# Patient Record
Sex: Male | Born: 1945 | Race: White | Hispanic: No | Marital: Married | Smoking: Former smoker
Health system: Southern US, Community
[De-identification: ages and names within clinical notes are randomized; demographics above are authoritative.]

## PROBLEM LIST (undated history)

## (undated) DIAGNOSIS — I10 Essential (primary) hypertension: Secondary | ICD-10-CM

## (undated) HISTORY — PX: CORONARY ANGIOPLASTY WITH STENT PLACEMENT: SHX49

---

## 2016-03-15 ENCOUNTER — Emergency Department (HOSPITAL_COMMUNITY)
Admission: EM | Admit: 2016-03-15 | Discharge: 2016-03-15 | Disposition: A | Discharge status: Home / Self Care | Payer: Non-veteran care | Attending: Emergency Medicine | Admitting: Emergency Medicine

## 2016-03-15 ENCOUNTER — Emergency Department (HOSPITAL_COMMUNITY): Payer: Non-veteran care

## 2016-03-15 ENCOUNTER — Encounter (HOSPITAL_COMMUNITY): Payer: Self-pay | Admitting: Emergency Medicine

## 2016-03-15 DIAGNOSIS — R52 Pain, unspecified: Secondary | ICD-10-CM

## 2016-03-15 DIAGNOSIS — Z87891 Personal history of nicotine dependence: Secondary | ICD-10-CM | POA: Diagnosis not present

## 2016-03-15 DIAGNOSIS — Z7982 Long term (current) use of aspirin: Secondary | ICD-10-CM | POA: Diagnosis not present

## 2016-03-15 DIAGNOSIS — I10 Essential (primary) hypertension: Secondary | ICD-10-CM | POA: Diagnosis not present

## 2016-03-15 DIAGNOSIS — Z79899 Other long term (current) drug therapy: Secondary | ICD-10-CM | POA: Insufficient documentation

## 2016-03-15 DIAGNOSIS — Z955 Presence of coronary angioplasty implant and graft: Secondary | ICD-10-CM | POA: Diagnosis not present

## 2016-03-15 DIAGNOSIS — M4806 Spinal stenosis, lumbar region: Secondary | ICD-10-CM | POA: Diagnosis not present

## 2016-03-15 DIAGNOSIS — Z7984 Long term (current) use of oral hypoglycemic drugs: Secondary | ICD-10-CM | POA: Diagnosis not present

## 2016-03-15 DIAGNOSIS — R109 Unspecified abdominal pain: Secondary | ICD-10-CM | POA: Insufficient documentation

## 2016-03-15 DIAGNOSIS — M549 Dorsalgia, unspecified: Secondary | ICD-10-CM | POA: Diagnosis present

## 2016-03-15 DIAGNOSIS — M48061 Spinal stenosis, lumbar region without neurogenic claudication: Secondary | ICD-10-CM

## 2016-03-15 HISTORY — DX: Essential (primary) hypertension: I10

## 2016-03-15 MED ORDER — OXYCODONE HCL 5 MG PO TABS
5.0000 mg | ORAL_TABLET | Freq: Once | ORAL | Status: AC
  Administered 2016-03-15: 5 mg via ORAL
  Filled 2016-03-15: qty 1

## 2016-03-15 MED ORDER — ACETAMINOPHEN 500 MG PO TABS
1000.0000 mg | ORAL_TABLET | Freq: Once | ORAL | Status: AC
  Administered 2016-03-15: 1000 mg via ORAL
  Filled 2016-03-15: qty 2

## 2016-03-15 MED ORDER — MORPHINE SULFATE 15 MG PO TABS: 15.0000 mg | ORAL_TABLET | ORAL | 0 refills | Status: AC | PRN

## 2016-03-15 NOTE — ED Triage Notes (Signed)
Pt c/o B/L thigh pain that occurs when he stands up and walks for a while.

## 2016-03-15 NOTE — ED Provider Notes (Signed)
[facility]-EMERGENCY DEPT Provider Note   CSN: [id number] Arrival date & time: 03/15/16  1459     History   Chief Complaint Chief Complaint  Patient presents with  . Leg Pain    HPI Todd Ortiz is a 70 y.o. male.  70 yo M with a chief complaints of bilateral hamstring pain. Patient points just beneath his HTN bilaterally. This occurs when the patient stands up and walks. The patient walks for long distances normally improves. Patient having significant pain with ambulation. Denies cauda equina symptoms. Denies fevers. Denies injury.   The history is provided by the patient.  Leg Pain   This is a new problem. The current episode started more than 1 week ago. The problem occurs constantly. The problem has been gradually worsening. Pain location: Bilateral hamstrings. The pain is at a severity of 7/10. The pain is moderate. Associated symptoms include stiffness. He has tried nothing for the symptoms. The treatment provided no relief. There has been no history of extremity trauma.    Past Medical History:  Diagnosis Date  . Hypertension     There are no active problems to display for this patient.   Past Surgical History:  Procedure Laterality Date  . CORONARY ANGIOPLASTY WITH STENT PLACEMENT         Home Medications    Prior to Admission medications   Medication Sig Start Date End Date Taking? Authorizing Provider  aspirin EC 81 MG tablet Take 81 mg by mouth daily.   Yes Historical Provider, MD  ATORVASTATIN CALCIUM PO Take 1 tablet by mouth daily.   Yes Historical Provider, MD  lisinopril (PRINIVIL,ZESTRIL) 10 MG tablet Take 10 mg by mouth daily.   Yes Historical Provider, MD  metFORMIN (GLUCOPHAGE) 500 MG tablet Take 500 mg by mouth 2 (two) times daily with a meal.   Yes Historical Provider, MD  morphine (MSIR) 15 MG tablet Take 1 tablet (15 mg total) by mouth every 4 (four) hours as needed for severe pain. 03/15/16   Melene Planan Threasa Kinch, DO    Family History No family history on  file.  Social History Social History  Substance Use Topics  . Smoking status: Former Games developermoker  . Smokeless tobacco: Never Used  . Alcohol use Yes     Allergies   Review of patient's allergies indicates no known allergies.   Review of Systems Review of Systems  Constitutional: Negative for chills and fever.  HENT: Negative for congestion and facial swelling.   Eyes: Negative for discharge and visual disturbance.  Respiratory: Negative for shortness of breath.   Cardiovascular: Negative for chest pain and palpitations.  Gastrointestinal: Negative for abdominal pain, diarrhea and vomiting.  Musculoskeletal: Positive for back pain, gait problem, myalgias and stiffness. Negative for arthralgias.  Skin: Negative for color change and rash.  Neurological: Negative for tremors, syncope and headaches.  Psychiatric/Behavioral: Negative for confusion and dysphoric mood.     Physical Exam Updated Vital Signs BP 146/76 (BP Location: Right Arm)   Pulse 76   Temp 98.3 F (36.8 C) (Oral)   Resp 18   Ht 5\' 8"  (1.727 m)   Wt 229 lb 9 oz (104.1 kg)   SpO2 97%   BMI 34.90 kg/m   Physical Exam  Constitutional: He is oriented to person, place, and time. He appears well-developed and well-nourished.  HENT:  Head: Normocephalic and atraumatic.  Eyes: Conjunctivae and EOM are normal. Pupils are equal, round, and reactive to light.  Neck: Normal range of motion. No JVD  present.  Cardiovascular: Normal rate and regular rhythm.   Pulmonary/Chest: Effort normal. No stridor. No respiratory distress.  Abdominal: He exhibits no distension. There is no tenderness. There is no guarding.  Musculoskeletal: Normal range of motion. He exhibits tenderness (Hamstring bilaterally). He exhibits no edema.  No midline spinal tenderness. Pulse motor and sensation is intact distally bilaterally.  Neurological: He is alert and oriented to person, place, and time.  Skin: Skin is warm and dry.  Psychiatric: He  has a normal mood and affect. His behavior is normal.     ED Treatments / Results  Labs (all labs ordered are listed, but only abnormal results are displayed) Labs Reviewed - No data to display  EKG  EKG Interpretation None       Radiology Ct L-spine No Charge  Result Date: 03/15/2016 CLINICAL DATA:  Chronic back pain. EXAM: CT LUMBAR SPINE WITHOUT CONTRAST TECHNIQUE: Multiplanar CT image reconstructions of the lumbar spine were generated from noncontrast abdominal CT. COMPARISON:  None. FINDINGS: Normal lumbar segmentation and alignment. No evidence of acute fracture, focal bone lesion, or endplate erosion. There is spondyloarthropathy with ankylosis from at least T11 to L4. Superiorly these have the appearance of syndesmophytes, at L2-3 and L3-4 there are bulky osteophytes. Sacroiliac ankylosis. Soft tissue findings in the retroperitoneum reported separately. Disc levels: L1-L2: Endplate ridging and right facet hypertrophy causes advanced right foraminal stenosis. Mild canal stenosis suspected. L2-L3: Posterior endplate osteophyte. Facet arthropathy with hypertrophy greater on the left. Interspinous degenerative changes. Mild to moderate canal stenosis. No suspected foraminal impingement. L3-L4: Posterior endplate osteophyte. Hyper trophic facet arthropathy and interspinous degenerative change. Mild canal stenosis. Patent foramina. L4-L5: Disc narrowing and bulging. Hyper trophic facet arthropathy and interspinous degenerative change. Moderate range canal stenosis. Advanced biforaminal stenosis, greater on the left. L5-S1:Mild disc bulging. Hyper trophic facet arthropathy. Patent canal. Mild left foraminal stenosis. IMPRESSION: 1. No acute lumbar spine finding. 2. 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 3. L1-2 advanced right foraminal stenosis. 4. L4-5 moderate canal stenosis and biforaminal impingement. 5. Noncompressive degenerative changes described above. 6.  Abdominal CT reported separately. Electronically Signed   By: Marnee SpringJonathon  Watts M.D.   On: 03/15/2016 21:55   Ct Renal Stone Study  Result Date: 03/15/2016 CLINICAL DATA:  Bilateral flank pain and low back pain since yesterday. EXAM: CT ABDOMEN AND PELVIS WITHOUT CONTRAST TECHNIQUE: Multidetector CT imaging of the abdomen and pelvis was performed following the standard protocol without IV contrast. COMPARISON:  None. FINDINGS: Cardiomegaly. Scattered atelectasis in the lung bases. No other lung base abnormalities. No free air or free fluid. Vascular calcifications are seen in the bilateral renal hila. No renal stones, hydronephrosis, or perinephric stranding. No ureterectasis or ureteral stones. Bilateral renal cysts are identified. The liver, gallbladder, spleen adrenal glands, and pancreas are normal. Atherosclerotic change seen in the non aneurysmal aorta. No adenopathy. There is a small duodenal diverticulum. The small bowel is otherwise unremarkable. Colonic diverticulosis is seen with no colonic diverticulitis. The appendix is normal. There is evidence of previous right inguinal hernia repair with surgical clips. No adenopathy or mass in the pelvis. The bladder is normal. The prostate contains calcifications but is otherwise unremarkable. Severe degenerative changes are seen within knee lumbar spine. This will be more completely described on the dedicated lumbar spine CT. Severe lower lumbar facet degenerative changes are identified. No acute fracture or malalignment. Degenerative changes are seen in the hips. IMPRESSION: 1. No acute abnormality in the soft tissues of the lower  chest, abdomen, or pelvis. No renal stones or obstruction. 2. Severe degenerative changes in the lumbar spine will be detailed in the dedicated CT of the lumbar spine study. Electronically Signed   By: Gerome Sam III M.D   On: 03/15/2016 21:48    Procedures Procedures (including critical care time)  Medications Ordered in  ED Medications  acetaminophen (TYLENOL) tablet 1,000 mg (1,000 mg Oral Given 03/15/16 1902)  oxyCODONE (Oxy IR/ROXICODONE) immediate release tablet 5 mg (5 mg Oral Given 03/15/16 1902)     Initial Impression / Assessment and Plan / ED Course  I have reviewed the triage vital signs and the nursing notes.  Pertinent labs & imaging results that were available during my care of the patient were reviewed by me and considered in my medical decision making (see chart for details).  Clinical Course    70 yo M With a chief complaint of bilateral leg pain. This occurs when the patient walks. Patient's symptoms by history sounds typical of spinal stenosis. Patient has no neurologic deficits, pulses intact. I will obtain a CT scan.  CT with acute fracture or malalignment.  Some concern for spinal stenosis.  PCP follow up.   11:30 PM:  I have discussed the diagnosis/risks/treatment options with the patient and believe the pt to be eligible for discharge home to follow-up with PCP. We also discussed returning to the ED immediately if new or worsening sx occur. We discussed the sx which are most concerning (e.g., sudden worsening pain, fever, inability to tolerate by mouth) that necessitate immediate return. Medications administered to the patient during their visit and any new prescriptions provided to the patient are listed below.  Medications given during this visit Medications  acetaminophen (TYLENOL) tablet 1,000 mg (1,000 mg Oral Given 03/15/16 1902)  oxyCODONE (Oxy IR/ROXICODONE) immediate release tablet 5 mg (5 mg Oral Given 03/15/16 1902)     The patient appears reasonably screen and/or stabilized for discharge and I doubt any other medical condition or other [facility] requiring further screening, evaluation, or treatment in the ED at this time prior to discharge.    Final Clinical Impressions(s) / ED Diagnoses   Final diagnoses:  Spinal stenosis of lumbar region    New Prescriptions Discharge  Medication List as of 03/15/2016 10:25 PM    START taking these medications   Details  morphine (MSIR) 15 MG tablet Take 1 tablet (15 mg total) by mouth every 4 (four) hours as needed for severe pain., Starting Sat 03/15/2016, Print         Melene Plan, DO 03/15/16 2330

## 2016-03-15 NOTE — ED Notes (Signed)
Pt to CT

## 2016-03-15 NOTE — ED Notes (Signed)
Pt d/c home  

## 2016-03-15 NOTE — ED Notes (Signed)
Pt back from CT

## 2016-03-15 NOTE — Discharge Instructions (Signed)
Take tylenol 1000mg(2 extra strength) four times a day.  ° °Then take the pain medicine if you feel like you need it. Narcotics do not help with the pain, they only make you care about it less.  You can become addicted to this, people may break into your house to steal it.  It will constipate you.  If you drive under the influence of this medicine you can get a DUI.   ° °

## 2017-10-07 IMAGING — CT CT L SPINE W/O CM
4 of 8 series · 8 of 33 positions shown, 9 images · non-contrast
Comparison: None.

CLINICAL DATA: Chronic back pain.

EXAM:
CT LUMBAR SPINE WITHOUT CONTRAST
TECHNIQUE: Multiplanar CT image reconstructions of the lumbar spine were
generated from noncontrast abdominal CT.

[Series 2014: cor abd · coronal · 0.45mm/px · 2 of 152 slices shown]
[im 51/152  bone]
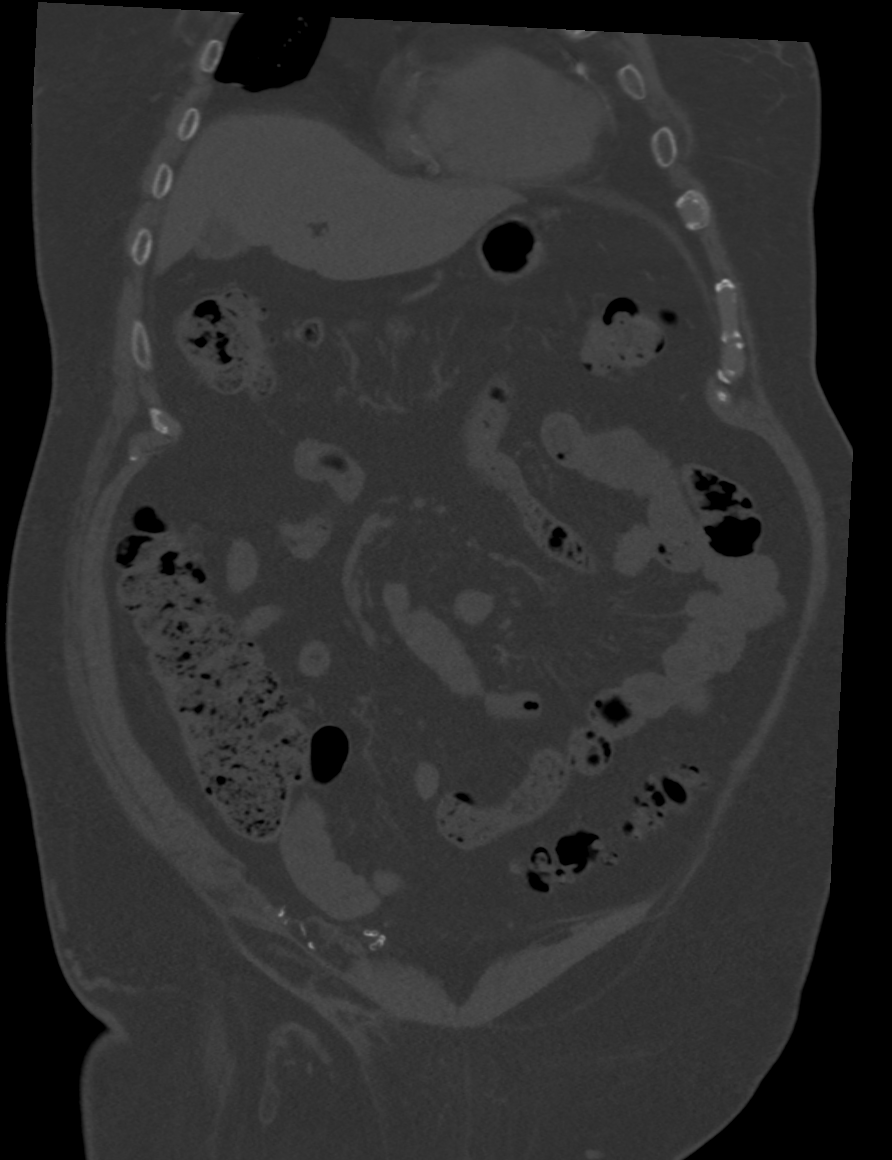
[im 101/152  bone]
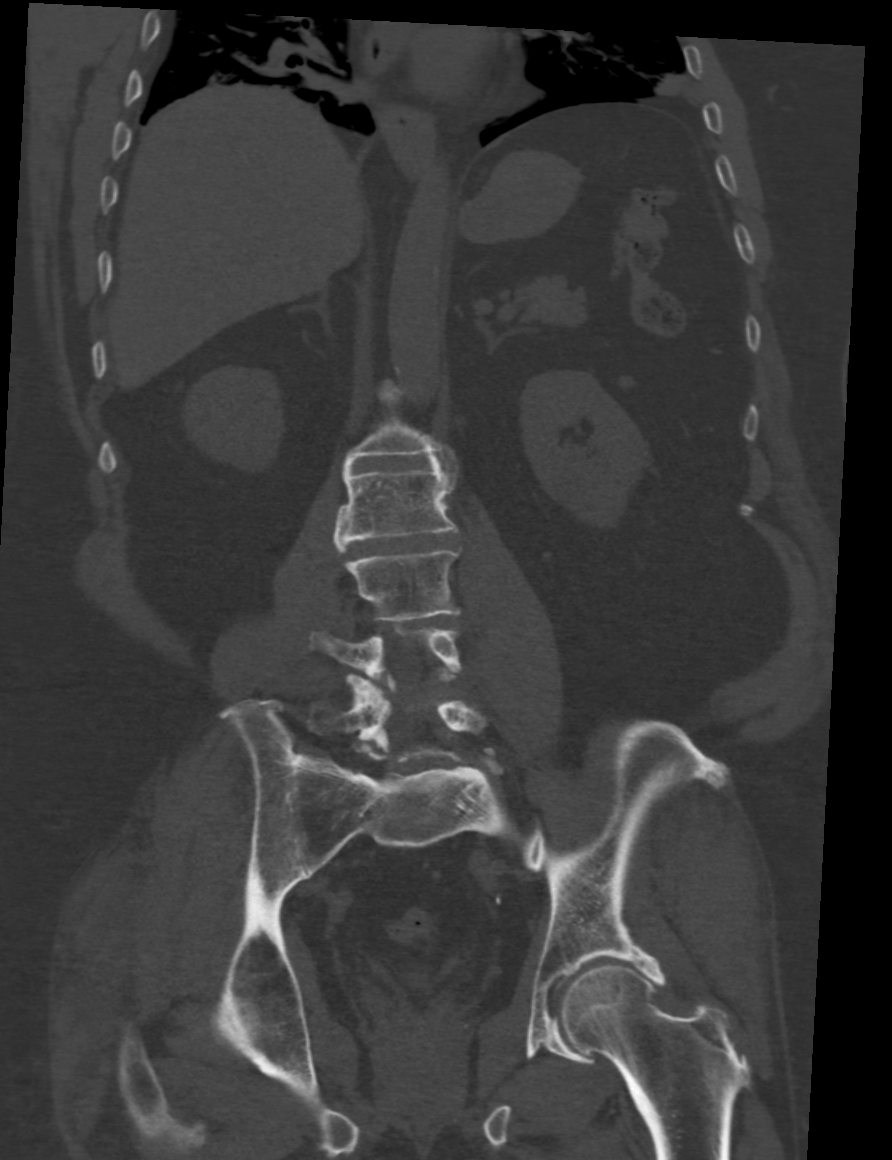

[Series 2017: axial lumbar mpr · axial · 0.48mm/px · z∈[-318,-218]mm · 2 of 151 slices shown, 3 images]
[im 51/151  soft-tissue]
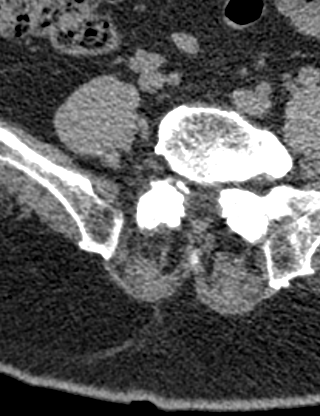
[im 51/151  bone]
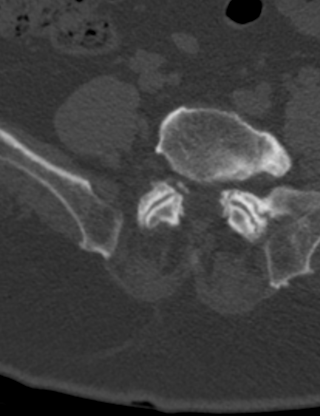
[im 101/151  bone]
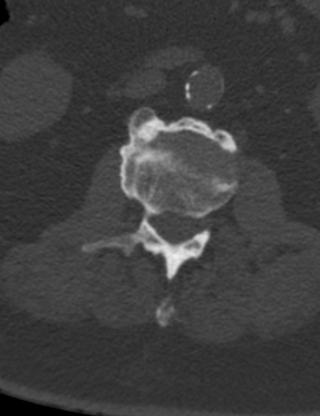

[Series 2019: sag lumbar mpr · sagittal · 0.45mm/px · 2 of 84 slices shown]
[im 28/84  bone]
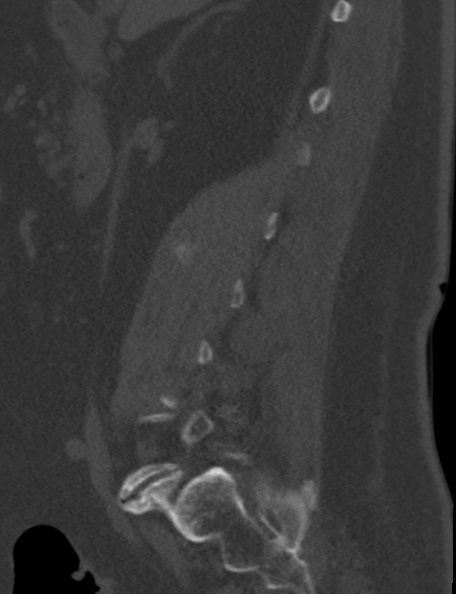
[im 56/84  bone]
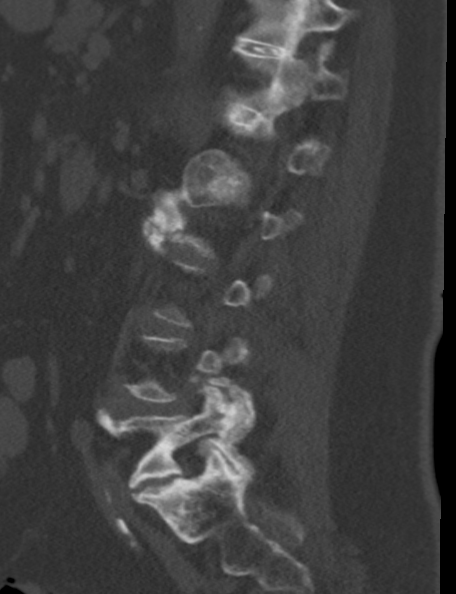

[Series 2020: axial st lumbar · axial · 0.52mm/px · z∈[-310,-212]mm · 2 of 148 slices shown]
[im 50/148  bone]
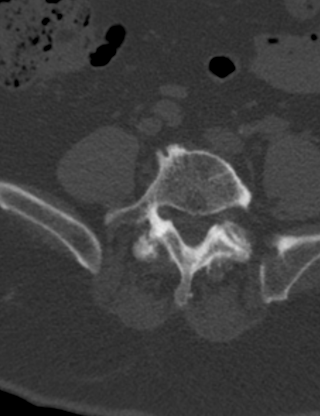
[im 99/148  bone]
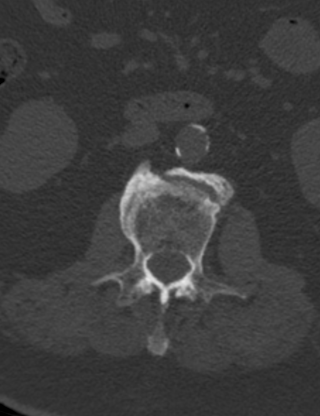

[8 of 33 positions shown; findings below may reference images not displayed]

FINDINGS: Normal lumbar segmentation and alignment.

No evidence of acute fracture, focal bone lesion, or endplate
erosion.

There is spondyloarthropathy with ankylosis from at least T11 to L4.
Superiorly these have the appearance of syndesmophytes, at L2-3 and
L3-4 there are bulky osteophytes. Sacroiliac ankylosis.

Soft tissue findings in the retroperitoneum reported separately.

Disc levels:

L1-L2: Endplate ridging and right facet hypertrophy causes advanced
right foraminal stenosis. Mild canal stenosis suspected.

L2-L3: Posterior endplate osteophyte. Facet arthropathy with
hypertrophy greater on the left. Interspinous degenerative changes.
Mild to moderate canal stenosis. No suspected foraminal impingement.

L3-L4: Posterior endplate osteophyte. Hyper trophic facet
arthropathy and interspinous degenerative change. Mild canal
stenosis. Patent foramina.

L4-L5: Disc narrowing and bulging. Hyper trophic facet arthropathy
and interspinous degenerative change. Moderate range canal stenosis.
Advanced biforaminal stenosis, greater on the left.

L5-S1:Mild disc bulging. Hyper trophic facet arthropathy. Patent
canal. Mild left foraminal stenosis.
IMPRESSION: 1. No acute lumbar spine finding.
2. Spondyloarthropathy with L4 to thoracic ankylosis. Sacroiliac
ankylosis.
3. L1-2 advanced right foraminal stenosis.
4. L4-5 moderate canal stenosis and biforaminal impingement.
5. Noncompressive degenerative changes described above.
6. Abdominal CT reported separately.
